# Patient Record
Sex: Male | Born: 1995 | Hispanic: No | Marital: Single | Smoking: Never smoker
Health system: Southern US, Community
[De-identification: ages and names within clinical notes are randomized; demographics above are authoritative.]

## PROBLEM LIST (undated history)

## (undated) ENCOUNTER — Emergency Department (HOSPITAL_COMMUNITY): Payer: Self-pay

## (undated) DIAGNOSIS — J309 Allergic rhinitis, unspecified: Secondary | ICD-10-CM

## (undated) HISTORY — DX: Allergic rhinitis, unspecified: J30.9

## (undated) HISTORY — PX: ADENOIDECTOMY: SUR15

---
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
## 2014-12-30 ENCOUNTER — Encounter: Payer: Self-pay | Admitting: Family Medicine

## 2014-12-30 ENCOUNTER — Telehealth: Payer: Self-pay | Admitting: Family Medicine

## 2014-12-30 NOTE — Progress Notes (Signed)
Printed off and attached [facility] copy for patient's mother to pick up. Spoke with mother and informed her of form up front ready for pick up.

## 2014-12-30 NOTE — Progress Notes (Signed)
Will forward to [name] who is working on the other siblings vaccine records. Frank Vazquez,CMA  

## 2014-12-30 NOTE — Progress Notes (Signed)
Mother is dropping off Lif[employer] Immunization Record to be completed so that it is up to date, please call the mother, Rajja Zaid, at [phone] when ready for pick up / thanks Sadie Reynolds, ASA °

## 2014-12-31 NOTE — Telephone Encounter (Signed)
N/a (meant to click encounter, not telephone call)
